# Patient Record
Sex: Male | Born: 1955 | Race: White | Hispanic: No | Marital: Married | Smoking: Former smoker
Health system: Southern US, Community
[De-identification: ages and names within clinical notes are randomized; demographics above are authoritative.]

## PROBLEM LIST (undated history)

## (undated) DIAGNOSIS — E785 Hyperlipidemia, unspecified: Secondary | ICD-10-CM

## (undated) HISTORY — DX: Hyperlipidemia, unspecified: E78.5

## (undated) HISTORY — PX: EYE SURGERY: SHX253

---

## 2012-01-02 ENCOUNTER — Ambulatory Visit (INDEPENDENT_AMBULATORY_CARE_PROVIDER_SITE_OTHER): Payer: BC Managed Care – PPO | Admitting: Family Medicine

## 2012-01-02 ENCOUNTER — Encounter: Payer: Self-pay | Admitting: Family Medicine

## 2012-01-02 VITALS — BP 145/85 | HR 62 | Temp 97.9°F | Resp 16 | Ht 72.0 in | Wt 265.2 lb

## 2012-01-02 DIAGNOSIS — E782 Mixed hyperlipidemia: Secondary | ICD-10-CM

## 2012-01-02 DIAGNOSIS — Z23 Encounter for immunization: Secondary | ICD-10-CM

## 2012-01-02 DIAGNOSIS — Z0000 Encounter for general adult medical examination without abnormal findings: Secondary | ICD-10-CM

## 2012-01-02 DIAGNOSIS — E785 Hyperlipidemia, unspecified: Secondary | ICD-10-CM

## 2012-01-02 DIAGNOSIS — N529 Male erectile dysfunction, unspecified: Secondary | ICD-10-CM

## 2012-01-02 MED ORDER — TETANUS-DIPHTH-ACELL PERTUSSIS 5-2.5-18.5 LF-MCG/0.5 IM SUSP
0.5000 mL | Freq: Once | INTRAMUSCULAR | Status: AC
  Administered 2012-01-02: 0.5 mL via INTRAMUSCULAR

## 2012-01-02 MED ORDER — SILDENAFIL CITRATE 100 MG PO TABS: 50.0000 mg | ORAL_TABLET | Freq: Every day | ORAL | Status: DC | PRN

## 2012-01-02 MED ORDER — SIMVASTATIN 10 MG PO TABS: 10.0000 mg | ORAL_TABLET | Freq: Every day | ORAL | Status: DC

## 2012-01-02 NOTE — Progress Notes (Signed)
  Subjective:    Patient ID: Curtis Gallegos, male    DOB: 22-Feb-1956, 56 y.o.   MRN: [id number]  HPI Curtis Gallegos is a 56 y.o. male New patient.  Here for annual exam.  See patient health survey. Colonoscopy at age 53 - normal EKG WNL 2 years ago. Hx of elevated PSA - had biopsy that was benign - outside records pending  Hyperlipidemia - on zocor 10mg  qd.  No  New side effects.  ED - prior on Viagra - 2x/month  No side effects.  Review of Systems See 13 point ros on PHS.    Objective:   Physical Exam  Constitutional: He is oriented to person, place, and time. He appears well-developed and well-nourished.  HENT:  Head: Normocephalic and atraumatic.  Right Ear: External ear normal.  Left Ear: External ear normal.  Mouth/Throat: Oropharynx is clear and moist.  Eyes: Conjunctivae and EOM are normal. Pupils are equal, round, and reactive to light.  Neck: Normal range of motion. Neck supple. No thyromegaly present.  Cardiovascular: Normal rate, regular rhythm, normal heart sounds and intact distal pulses.   Pulmonary/Chest: Effort normal and breath sounds normal. No respiratory distress. He has no wheezes.  Abdominal: Soft. He exhibits no distension. There is no tenderness. Hernia confirmed negative in the right inguinal area and confirmed negative in the left inguinal area.  Genitourinary:       Prostate exam declined.  Musculoskeletal: Normal range of motion. He exhibits no edema and no tenderness.  Lymphadenopathy:    He has no cervical adenopathy.  Neurological: He is alert and oriented to person, place, and time. He has normal reflexes.  Skin: Skin is warm and dry.  Psychiatric: He has a normal mood and affect. His behavior is normal.       Assessment & Plan:  Curtis Gallegos is a 56 y.o. male 1. Need for diphtheria-tetanus-pertussis (Tdap) vaccine  TDaP (BOOSTRIX) injection 0.5 mL  2. Annual physical exam  TDaP (BOOSTRIX) injection 0.5 mL, Comprehensive metabolic panel, CBC, PSA    3. Hyperlipidemia  Comprehensive metabolic panel, Lipid panel  4. Erectile dysfunction     Cont zocor at same dose until labs back as above. Check BP outside office - RTC if remains above over 140/90 Obtaining outside records. Check cbc, lipids, cmp, psa tdap updated.

## 2012-01-03 LAB — COMPREHENSIVE METABOLIC PANEL
Albumin: 4.5 g/dL (ref 3.5–5.2)
BUN: 20 mg/dL (ref 6–23)
CO2: 27 mEq/L (ref 19–32)
Calcium: 9.6 mg/dL (ref 8.4–10.5)
Chloride: 102 mEq/L (ref 96–112)
Creat: 0.84 mg/dL (ref 0.50–1.35)
Glucose, Bld: 99 mg/dL (ref 70–99)
Potassium: 4.2 mEq/L (ref 3.5–5.3)

## 2012-01-03 LAB — CBC
HCT: 45 % (ref 39.0–52.0)
MCH: 30.2 pg (ref 26.0–34.0)
MCV: 90.7 fL (ref 78.0–100.0)
Platelets: 303 10*3/uL (ref 150–400)
RDW: 13.4 % (ref 11.5–15.5)

## 2012-01-03 LAB — LIPID PANEL
Cholesterol: 220 mg/dL — ABNORMAL HIGH (ref 0–200)
HDL: 58 mg/dL
LDL Cholesterol: 144 mg/dL — ABNORMAL HIGH (ref 0–99)
Total CHOL/HDL Ratio: 3.8 ratio
Triglycerides: 88 mg/dL
VLDL: 18 mg/dL (ref 0–40)

## 2012-01-03 LAB — PSA: PSA: 3.29 ng/mL

## 2012-03-23 ENCOUNTER — Telehealth: Payer: Self-pay

## 2012-03-23 NOTE — Telephone Encounter (Signed)
Pt is out of his rx of sinvastatin and [facility] states they will refill on the 20th but he is out of meds and needs some to get him thru til the 20th [facility] [address]

## 2012-03-24 MED ORDER — SIMVASTATIN 20 MG PO TABS: 20.0000 mg | ORAL_TABLET | Freq: Every day | ORAL | Status: DC

## 2012-03-24 NOTE — Telephone Encounter (Signed)
Pt notified that rx was sent in 

## 2012-03-24 NOTE — Telephone Encounter (Signed)
I sent in Zocor 20mg  for patient.  He should only have to take 1 pill with this Rx.

## 2012-04-21 ENCOUNTER — Ambulatory Visit: Payer: BC Managed Care – PPO

## 2012-04-21 ENCOUNTER — Ambulatory Visit: Payer: BC Managed Care – PPO | Admitting: Family Medicine

## 2012-04-21 VITALS — BP 155/92 | HR 65 | Temp 97.8°F | Resp 18 | Ht 72.0 in | Wt 266.8 lb

## 2012-04-21 DIAGNOSIS — M25473 Effusion, unspecified ankle: Secondary | ICD-10-CM

## 2012-04-21 DIAGNOSIS — M25476 Effusion, unspecified foot: Secondary | ICD-10-CM

## 2012-04-21 DIAGNOSIS — M25579 Pain in unspecified ankle and joints of unspecified foot: Secondary | ICD-10-CM

## 2012-04-21 MED ORDER — NAPROXEN 500 MG PO TABS: 500.0000 mg | ORAL_TABLET | Freq: Two times a day (BID) | ORAL | Status: DC

## 2012-04-21 NOTE — Progress Notes (Signed)
[facility]:  Office Visit  Chief Complaint:  Chief Complaint  Patient presents with  . Ankle Pain     swollen and feel like a lump x 2 months    HPI: Curtis Gallegos is a 56 y.o. male who complains of  2 month h/o of ankle pain. After exercising. No other injuries. Tried a boot his sister gave him and started getting wounds on leg from pressure. Pain with increase walking and with dorsiflexion of foot. Sharp, intermittent. Tried Ibuprofen without relief. No steroid use or recent abx use  Past Medical History  Diagnosis Date  . Hyperlipidemia    History reviewed. No pertinent past surgical history. History   Social History  . Marital Status: Married    Spouse Name: N/A    Number of Children: N/A  . Years of Education: N/A   Social History Main Topics  . Smoking status: Former Smoker    Quit date: 01/02/2003  . Smokeless tobacco: None  . Alcohol Use: None  . Drug Use: None  . Sexually Active: None   Other Topics Concern  . None   Social History Narrative  . None   No family history on file. No Known Allergies Prior to Admission medications   Medication Sig Start Date End Date Taking? Authorizing Provider  ibuprofen (ADVIL,MOTRIN) 400 MG tablet Take 400 mg by mouth every 6 (six) hours as needed.   Yes Historical Provider, MD  simvastatin (ZOCOR) 20 MG tablet Take 1 tablet (20 mg total) by mouth at bedtime. 03/24/12 03/24/13 Yes Sarah Harvie Bridge, PA-C  sildenafil (VIAGRA) 100 MG tablet Take 0.5-1 tablets (50-100 mg total) by mouth daily as needed for erectile dysfunction. 01/02/12 02/01/12  Shade Flood, MD     ROS: The patient denies fevers, chills, night sweats, unintentional weight loss, chest pain, palpitations, wheezing, dyspnea on exertion, nausea, vomiting, abdominal pain, dysuria, hematuria, melena, numbness, weakness, or tingling.   All other systems have been reviewed and were otherwise negative with the exception of those mentioned in the HPI  and as above.    PHYSICAL EXAM: Filed Vitals:   04/21/12 2013  BP: 155/92  Pulse: 65  Temp: 97.8 F (36.6 C)  Resp: 18   Filed Vitals:   04/21/12 2013  Height: 6' (1.829 m)  Weight: 266 lb 12.8 oz (121.02 kg)   Body mass index is 36.18 kg/(m^2).  General: Alert, no acute distress HEENT:  Normocephalic, atraumatic, oropharynx patent.  Cardiovascular:  Regular rate and rhythm, no rubs murmurs or gallops.  No Carotid bruits, radial pulse intact. No pedal edema.  Respiratory: Clear to auscultation bilaterally.  No wheezes, rales, or rhonchi.  No cyanosis, no use of accessory musculature GI: No organomegaly, abdomen is soft and non-tender, positive bowel sounds.  No masses. Skin: No rashes. Neurologic: Facial musculature symmetric. Psychiatric: Patient is appropriate throughout our interaction. Lymphatic: No cervical lymphadenopathy Musculoskeletal: Gait intact. ROM and sensation intact 5.5 strength 2/2 DTR + softe tissue swelling lateral, medial malleoli and at achilles, no appreciable tendon rupture    LABS: Results for orders placed in visit on 01/02/12  COMPREHENSIVE METABOLIC PANEL      Component Value Range   Sodium 138  135 - 145 mEq/L   Potassium 4.2  3.5 - 5.3 mEq/L   Chloride 102  96 - 112 mEq/L   CO2 27  19 - 32 mEq/L   Glucose, Bld 99  70 - 99 mg/dL   BUN 20  6 -  23 mg/dL   Creat 1.61  0.96 - 0.45 mg/dL   Total Bilirubin 0.5  0.3 - 1.2 mg/dL   Alkaline Phosphatase 55  39 - 117 U/L   AST 17  0 - 37 U/L   ALT 16  0 - 53 U/L   Total Protein 7.2  6.0 - 8.3 g/dL   Albumin 4.5  3.5 - 5.2 g/dL   Calcium 9.6  8.4 - 40.9 mg/dL  LIPID PANEL      Component Value Range   Cholesterol 220 (*) 0 - 200 mg/dL   Triglycerides 88  <811 mg/dL   HDL 58  >91 mg/dL   Total CHOL/HDL Ratio 3.8     VLDL 18  0 - 40 mg/dL   LDL Cholesterol 478 (*) 0 - 99 mg/dL  CBC      Component Value Range   WBC 6.0  4.0 - 10.5 K/uL   RBC 4.96  4.22 - 5.81 MIL/uL   Hemoglobin 15.0  13.0 -  17.0 g/dL   HCT 29.5  62.1 - 30.8 %   MCV 90.7  78.0 - 100.0 fL   MCH 30.2  26.0 - 34.0 pg   MCHC 33.3  30.0 - 36.0 g/dL   RDW 65.7  84.6 - 96.2 %   Platelets 303  150 - 400 K/uL  PSA      Component Value Range   PSA 3.29  <=4.00 ng/mL     EKG/XRAY:   Primary read interpreted by Dr. Conley Rolls at [facility]. No fx or dislocation + soft tissue swelling achilles   ASSESSMENT/PLAN: Encounter Diagnoses  Name Primary?  Marland Kitchen Ankle pain Yes  . Ankle swelling    Most likely Achilles tendonopathy ROM exercises, advise to purchase heel lift ( patient declined cam boot) Rx NSAID He will try ROM exercises at home and if no improvement then would like to be referred to PT.     Serah Nicoletti PHUONG, DO 04/22/2012 7:54 AM

## 2012-04-22 ENCOUNTER — Encounter: Payer: Self-pay | Admitting: Family Medicine

## 2012-04-27 ENCOUNTER — Other Ambulatory Visit: Payer: Self-pay | Admitting: Physician Assistant

## 2012-05-07 ENCOUNTER — Telehealth: Payer: Self-pay

## 2012-05-07 NOTE — Telephone Encounter (Signed)
[place] is going [place] and would like for a request for rx refill on viagra sent to [facility] in [place] off on [address] [phone]

## 2012-05-08 MED ORDER — SILDENAFIL CITRATE 100 MG PO TABS: 50.0000 mg | ORAL_TABLET | Freq: Every day | ORAL | Status: DC | PRN

## 2012-05-08 NOTE — Telephone Encounter (Signed)
Sent!

## 2012-05-08 NOTE — Telephone Encounter (Signed)
Called patient to advise  °

## 2012-05-08 NOTE — Telephone Encounter (Signed)
Ok x 2 months  

## 2012-08-09 ENCOUNTER — Other Ambulatory Visit: Payer: Self-pay | Admitting: Physician Assistant

## 2012-08-10 NOTE — Telephone Encounter (Signed)
Needs OV - 2nd notice 

## 2012-08-17 ENCOUNTER — Telehealth: Payer: Self-pay

## 2012-08-17 MED ORDER — TADALAFIL 10 MG PO TABS: 10.0000 mg | ORAL_TABLET | Freq: Every day | ORAL | Status: DC | PRN

## 2012-08-17 NOTE — Telephone Encounter (Signed)
PT REQUESTING CIALIS RX INSTEAD OF VIAGRA SINCE HE HAS A COUPON FOR THE CIALIS   BEST PHONE [phone]   PHARMACY [facility]

## 2012-08-17 NOTE — Telephone Encounter (Signed)
Sent Cialis to pharamcy, pt will need OV for more

## 2012-08-18 NOTE — Telephone Encounter (Signed)
Pt answered the phone but I didn't speak with him. I will try to call back later

## 2012-08-18 NOTE — Telephone Encounter (Signed)
Spoke with pt advised Rx sent to pharmacy. 

## 2012-09-20 ENCOUNTER — Other Ambulatory Visit: Payer: Self-pay | Admitting: Physician Assistant

## 2012-09-20 ENCOUNTER — Telehealth: Payer: Self-pay

## 2012-09-20 NOTE — Telephone Encounter (Signed)
Needs office visit.

## 2012-09-20 NOTE — Telephone Encounter (Signed)
PT. NEEDS A REFILL OF HIS CHOLESTEROL MEDS UNTIL HE IS ABLE TO RECHECK. PLEASE CALL  HE USES THE [facility] ON [facility] & [place] [phone]

## 2012-09-20 NOTE — Telephone Encounter (Signed)
PT NEEDS A REFILL OF HIS CHOLESTEROL MEDS UNTIL HE CAN RECHECK. HE USES THE [facility] [address]  HIS # [phone]

## 2012-09-21 NOTE — Telephone Encounter (Signed)
Ryan sent in yesterday.

## 2012-10-26 ENCOUNTER — Other Ambulatory Visit: Payer: Self-pay | Admitting: Physician Assistant

## 2012-10-27 ENCOUNTER — Other Ambulatory Visit: Payer: Self-pay | Admitting: Physician Assistant

## 2012-11-15 ENCOUNTER — Encounter: Payer: Self-pay | Admitting: Family Medicine

## 2012-11-15 ENCOUNTER — Ambulatory Visit (INDEPENDENT_AMBULATORY_CARE_PROVIDER_SITE_OTHER): Payer: BC Managed Care – PPO | Admitting: Family Medicine

## 2012-11-15 VITALS — BP 138/88 | HR 68 | Temp 96.9°F | Resp 16 | Ht 72.0 in | Wt 260.0 lb

## 2012-11-15 DIAGNOSIS — N529 Male erectile dysfunction, unspecified: Secondary | ICD-10-CM

## 2012-11-15 DIAGNOSIS — Z87898 Personal history of other specified conditions: Secondary | ICD-10-CM

## 2012-11-15 DIAGNOSIS — E785 Hyperlipidemia, unspecified: Secondary | ICD-10-CM

## 2012-11-15 DIAGNOSIS — Z0000 Encounter for general adult medical examination without abnormal findings: Secondary | ICD-10-CM

## 2012-11-15 MED ORDER — SIMVASTATIN 20 MG PO TABS: ORAL_TABLET | ORAL | Status: DC

## 2012-11-15 MED ORDER — SILDENAFIL CITRATE 100 MG PO TABS: 50.0000 mg | ORAL_TABLET | Freq: Every day | ORAL | Status: DC | PRN

## 2012-11-15 NOTE — Progress Notes (Signed)
  Subjective:    Patient ID: Curtis Gallegos, male    DOB: 1956-01-08, 57 y.o.   MRN: [id number]  HPI    Review of Systems  Musculoskeletal: Positive for gait problem.       Problem with walking- Right ankle       Objective:   Physical Exam        Assessment & Plan:

## 2012-11-15 NOTE — Patient Instructions (Addendum)
Your should receive a call or letter about your lab results within the next week to 10 days.  Heat if needed and stretches to the achilles soreness, but recheck next few months if persistent. Return to the clinic or go to the nearest emergency room if any of your symptoms worsen or new symptoms occur.  Plan on recheck in 6 months.   Keeping you healthy  Get these tests  Blood pressure- Have your blood pressure checked once a year by your healthcare Alley Neils.  Normal blood pressure is 120/80  Weight- Have your body mass index (BMI) calculated to screen for obesity.  BMI is a measure of body fat based on height and weight. You can also calculate your own BMI at [email or web address].  Cholesterol- Have your cholesterol checked every year.  Diabetes- Have your blood sugar checked regularly if you have high blood pressure, high cholesterol, have a family history of diabetes or if you are overweight.  Screening for Colon Cancer- Colonoscopy starting at age 33.  Screening may begin sooner depending on your family history and other health conditions. Follow up colonoscopy as directed by your Gastroenterologist.  Screening for Prostate Cancer- Both blood work (PSA) and a rectal exam help screen for Prostate Cancer.  Screening begins at age 66 with African-American men and at age 27 with Caucasian men.  Screening may begin sooner depending on your family history.  Take these medicines  Aspirin- One aspirin daily can help prevent Heart disease and Stroke.  Flu shot- Every fall.  Tetanus- Every 10 years.  Zostavax- Once after the age of 56 to prevent Shingles.  Pneumonia shot- Once after the age of 58; if you are younger than 77, ask your healthcare Otie Headlee if you need a Pneumonia shot.  Take these steps  Don't smoke- If you do smoke, talk to your doctor about quitting.  For tips on how to quit, go to www.smokefree.gov or call 1-800-QUIT-NOW.  Be physically active- Exercise 5 days a  week for at least 30 minutes.  If you are not already physically active start slow and gradually work up to 30 minutes of moderate physical activity.  Examples of moderate activity include walking briskly, mowing the yard, dancing, swimming, bicycling, etc.  Eat a healthy diet- Eat a variety of healthy food such as fruits, vegetables, low fat milk, low fat cheese, yogurt, lean meant, poultry, fish, beans, tofu, etc. For more information go to www.thenutritionsource.org  Drink alcohol in moderation- Limit alcohol intake to less than two drinks a day. Never drink and drive.  Dentist- Brush and floss twice daily; visit your dentist twice a year.  Depression- Your emotional health is as important as your physical health. If you're feeling down, or losing interest in things you would normally enjoy please talk to your healthcare Dima Ferrufino.  Eye exam- Visit your eye doctor every year.  Safe sex- If you may be exposed to a sexually transmitted infection, use a condom.  Seat belts- Seat belts can save your life; always wear one.  Smoke/Carbon Monoxide detectors- These detectors need to be installed on the appropriate level of your home.  Replace batteries at least once a year.  Skin cancer- When out in the sun, cover up and use sunscreen 15 SPF or higher.  Violence- If anyone is threatening you, please tell your healthcare Mariesa Grieder.  Living Will/ Health care power of attorney- Speak with your healthcare Daley Mooradian and family.

## 2012-11-15 NOTE — Progress Notes (Signed)
Subjective:    Patient ID: Curtis Gallegos, male    DOB: November 04, 1955, 57 y.o.   MRN: [id number]  HPI Curtis Gallegos is a 57 y.o. male Here for annual exam  Colonoscopy at age 66 - normal  - possible polyp removed  In [place],  [place].  Possible polyp removed,  Unknown when to repeat colonoscopy. Previous PCP -  Flu vaccine in October 2013 tdap given at  annual exam 12/2011- stress test normal about 3 years ago.  EKG WNL approx 3 years ago.  Hx of elevated PSA - had biopsy that was benign in [place] - 3 years ago - follow up plan to have PSA followed.  PSA 3.29 here in April 2013.  Has not had rechecked since. No hematuria. No hesitancy, no driblling, no incontinence. No abd pain/pelvic pain. No unexplained wt. loss or night sweats.  walking on weekends only for exercise.  Hyperlipidemia - on zocor 20 mg qd. No new side effects. No new myalgias, but has ocasional pain in R achilles - see last ov.  Weight down from 265 to 260 since last CPE.  Results for orders placed in visit on 01/02/12  COMPREHENSIVE METABOLIC PANEL      Result Value Range   Sodium 138  135 - 145 mEq/L   Potassium 4.2  3.5 - 5.3 mEq/L   Chloride 102  96 - 112 mEq/L   CO2 27  19 - 32 mEq/L   Glucose, Bld 99  70 - 99 mg/dL   BUN 20  6 - 23 mg/dL   Creat 4.09  8.11 - 9.14 mg/dL   Total Bilirubin 0.5  0.3 - 1.2 mg/dL   Alkaline Phosphatase 55  39 - 117 U/L   AST 17  0 - 37 U/L   ALT 16  0 - 53 U/L   Total Protein 7.2  6.0 - 8.3 g/dL   Albumin 4.5  3.5 - 5.2 g/dL   Calcium 9.6  8.4 - 78.2 mg/dL  LIPID PANEL      Result Value Range   Cholesterol 220 (*) 0 - 200 mg/dL   Triglycerides 88  <956 mg/dL   HDL 58  >21 mg/dL   Total CHOL/HDL Ratio 3.8     VLDL 18  0 - 40 mg/dL   LDL Cholesterol 308 (*) 0 - 99 mg/dL  CBC      Result Value Range   WBC 6.0  4.0 - 10.5 K/uL   RBC 4.96  4.22 - 5.81 MIL/uL   Hemoglobin 15.0  13.0 - 17.0 g/dL   HCT 65.7  84.6 - 96.2 %   MCV 90.7  78.0 - 100.0 fL   MCH 30.2  26.0 - 34.0 pg   MCHC  33.3  30.0 - 36.0 g/dL   RDW 95.2  84.1 - 32.4 %   Platelets 303  150 - 400 K/uL  PSA      Result Value Range   PSA 3.29  <=4.00 ng/mL    ED - prior on Viagra - 2x/month - split 100mg  tabs - no headache lightheadedness.  No side effects, then tried Cialis - prefers Viagra.     Review of Systems  Constitutional: Negative for fatigue and unexpected weight change.  Eyes: Negative for visual disturbance.  Respiratory: Negative for cough, chest tightness and shortness of breath.   Cardiovascular: Negative for chest pain, palpitations and leg swelling.  Gastrointestinal: Negative for abdominal pain and blood in stool.  Genitourinary: Negative for dysuria, urgency, frequency,  hematuria, decreased urine volume and difficulty urinating.  Musculoskeletal: Positive for myalgias (R achilles stiff if driving for a long time, treats with stretches.  less sore nw than last summer, only  episodic flair., no meds needed. ).  Neurological: Negative for dizziness, light-headedness and headaches.  All other systems reviewed and are negative.        Objective:   Physical Exam  Vitals reviewed. Constitutional: He is oriented to person, place, and time. He appears well-developed and well-nourished.  HENT:  Head: Normocephalic and atraumatic.  Right Ear: External ear normal.  Left Ear: External ear normal.  Mouth/Throat: Oropharynx is clear and moist.  Eyes: Conjunctivae and EOM are normal. Pupils are equal, round, and reactive to light.  Neck: Normal range of motion. Neck supple. No thyromegaly present.  Cardiovascular: Normal rate, regular rhythm, normal heart sounds and intact distal pulses.   Pulmonary/Chest: Effort normal and breath sounds normal. No respiratory distress. He has no wheezes.  Abdominal: Soft. He exhibits no distension. There is no tenderness. Hernia confirmed negative in the right inguinal area and confirmed negative in the left inguinal area.  Genitourinary: Prostate normal.   Difficult exam, able to reach distal prostate - no apparent abnormalities.   Musculoskeletal: Normal range of motion. He exhibits no edema and no tenderness.  Lymphadenopathy:    He has no cervical adenopathy.  Neurological: He is alert and oriented to person, place, and time. He has normal reflexes.  Skin: Skin is warm and dry.  Psychiatric: He has a normal mood and affect. His behavior is normal.   EKG: sinus brady at 57, L axis, no acute findings - no prior available for review.   Results for orders placed in visit on 11/15/12  IFOBT (OCCULT BLOOD)      Result Value Range   IFOBT Negative        Assessment & Plan:  Curtis Gallegos is a 57 y.o. male Annual physical exam - Plan: IFOBT POC (occult bld, rslt in office), CBC, PSA, Comprehensive metabolic panel, EKG 12-Lead - no concerning findings.  Anticipatory guidance reviewed as below.  Patient to call GI in [place] where he had colonoscopy to send records to me or to determine when next colonoscopy due. hemosure negative as above.   History of elevated PSA - Plan: PSA.  Determine follow up based on PSA.   Other and unspecified hyperlipidemia - Plan: Comprehensive metabolic panel, Lipid panel, EKG 12-Lead, simvastatin (ZOCOR) 20 MG tablet refilled labs ordered. Can work on increased walking as able for exercise.   Erectile dysfunction - Plan: sildenafil (VIAGRA) 100 MG tablet in place of Cialis.  1/2 prn, sed, and CP/orhtostaic precautions reviewed.  Patient Instructions  Your should receive a call or letter about your lab results within the next week to 10 days.  Heat if needed and stretches to the achilles soreness, but recheck next few months if persistent. Return to the clinic or go to the nearest emergency room if any of your symptoms worsen or new symptoms occur.  Plan on recheck in 6 months.   Keeping you healthy  Get these tests  Blood pressure- Have your blood pressure checked once a year by your healthcare provider.   Normal blood pressure is 120/80  Weight- Have your body mass index (BMI) calculated to screen for obesity.  BMI is a measure of body fat based on height and weight. You can also calculate your own BMI at [email or web address].  Cholesterol- Have your cholesterol checked every year.  Diabetes- Have your blood sugar checked regularly if you have high blood pressure, high cholesterol, have a family history of diabetes or if you are overweight.  Screening for Colon Cancer- Colonoscopy starting at age 47.  Screening may begin sooner depending on your family history and other health conditions. Follow up colonoscopy as directed by your Gastroenterologist.  Screening for Prostate Cancer- Both blood work (PSA) and a rectal exam help screen for Prostate Cancer.  Screening begins at age 25 with African-American men and at age 38 with Caucasian men.  Screening may begin sooner depending on your family history.  Take these medicines  Aspirin- One aspirin daily can help prevent Heart disease and Stroke.  Flu shot- Every fall.  Tetanus- Every 10 years.  Zostavax- Once after the age of 80 to prevent Shingles.  Pneumonia shot- Once after the age of 43; if you are younger than 61, ask your healthcare provider if you need a Pneumonia shot.  Take these steps  Don't smoke- If you do smoke, talk to your doctor about quitting.  For tips on how to quit, go to www.smokefree.gov or call 1-800-QUIT-NOW.  Be physically active- Exercise 5 days a week for at least 30 minutes.  If you are not already physically active start slow and gradually work up to 30 minutes of moderate physical activity.  Examples of moderate activity include walking briskly, mowing the yard, dancing, swimming, bicycling, etc.  Eat a healthy diet- Eat a variety of healthy food such as fruits, vegetables, low fat milk, low fat cheese, yogurt, lean meant, poultry, fish, beans, tofu, etc. For more information go to  www.thenutritionsource.org  Drink alcohol in moderation- Limit alcohol intake to less than two drinks a day. Never drink and drive.  Dentist- Brush and floss twice daily; visit your dentist twice a year.  Depression- Your emotional health is as important as your physical health. If you're feeling down, or losing interest in things you would normally enjoy please talk to your healthcare provider.  Eye exam- Visit your eye doctor every year.  Safe sex- If you may be exposed to a sexually transmitted infection, use a condom.  Seat belts- Seat belts can save your life; always wear one.  Smoke/Carbon Monoxide detectors- These detectors need to be installed on the appropriate level of your home.  Replace batteries at least once a year.  Skin cancer- When out in the sun, cover up and use sunscreen 15 SPF or higher.  Violence- If anyone is threatening you, please tell your healthcare provider.  Living Will/ Health care power of attorney- Speak with your healthcare provider and family.

## 2012-11-16 LAB — COMPREHENSIVE METABOLIC PANEL
AST: 20 U/L (ref 0–37)
BUN: 14 mg/dL (ref 6–23)
Calcium: 9.4 mg/dL (ref 8.4–10.5)
Chloride: 102 mEq/L (ref 96–112)
Creat: 0.91 mg/dL (ref 0.50–1.35)
Glucose, Bld: 93 mg/dL (ref 70–99)

## 2012-11-16 LAB — LIPID PANEL
HDL: 52 mg/dL (ref >39)
Total CHOL/HDL Ratio: 3.9 Ratio
Triglycerides: 150 mg/dL — ABNORMAL HIGH (ref <150)

## 2012-11-16 LAB — CBC
MCH: 30.9 pg (ref 26.0–34.0)
MCV: 86.9 fL (ref 78.0–100.0)
Platelets: 287 10*3/uL (ref 150–400)
RBC: 5.05 MIL/uL (ref 4.22–5.81)
RDW: 13.9 % (ref 11.5–15.5)

## 2012-12-22 ENCOUNTER — Telehealth: Payer: Self-pay

## 2012-12-22 NOTE — Telephone Encounter (Signed)
Curtis Gallegos is calling about needing to get some records faxed and he was told that he would need to come in and fill out a release. He is not able to come in and fill it out but was wondering if he could  Have someone fax him the release to fill out. His fax number is [phone]. His call back number is [phone]

## 2012-12-22 NOTE — Telephone Encounter (Signed)
Release of information form faxed with confirmation.

## 2013-02-14 ENCOUNTER — Ambulatory Visit (INDEPENDENT_AMBULATORY_CARE_PROVIDER_SITE_OTHER): Payer: BC Managed Care – PPO | Admitting: Family Medicine

## 2013-02-14 ENCOUNTER — Encounter: Payer: Self-pay | Admitting: Family Medicine

## 2013-02-14 VITALS — BP 118/66 | HR 88 | Temp 98.1°F | Resp 16 | Ht 72.0 in | Wt 267.8 lb

## 2013-02-14 DIAGNOSIS — L02419 Cutaneous abscess of limb, unspecified: Secondary | ICD-10-CM

## 2013-02-14 DIAGNOSIS — L255 Unspecified contact dermatitis due to plants, except food: Secondary | ICD-10-CM

## 2013-02-14 DIAGNOSIS — L03115 Cellulitis of right lower limb: Secondary | ICD-10-CM

## 2013-02-14 LAB — POCT CBC
Granulocyte percent: 74.2 %G (ref 37–80)
HCT, POC: 51.1 % (ref 43.5–53.7)
Lymph, poc: 1.5 (ref 0.6–3.4)
MCH, POC: 31.2 pg (ref 27–31.2)
MCHC: 32.1 g/dL (ref 31.8–35.4)
MCV: 97.2 fL — AB (ref 80–97)
MID (cbc): 0.4 (ref 0–0.9)
POC LYMPH PERCENT: 20.4 %L (ref 10–50)
Platelet Count, POC: 298 10*3/uL (ref 142–424)
RDW, POC: 12.9 %
WBC: 7.3 10*3/uL (ref 4.6–10.2)

## 2013-02-14 MED ORDER — DOXYCYCLINE HYCLATE 100 MG PO TABS: 100.0000 mg | ORAL_TABLET | Freq: Two times a day (BID) | ORAL | Status: DC

## 2013-02-14 MED ORDER — PREDNISONE 20 MG PO TABS: ORAL_TABLET | ORAL | Status: DC

## 2013-02-14 NOTE — Patient Instructions (Signed)
Your rash appears to be primarily from poison ivy, but possible secondary bacterial infection. Start both the prednisone and doxycycline, cleanse areas with soap and water 1-2 times per day, and recheck with Dr. Neva Seat in 2 days from 8:30 to 4.  Return to the clinic or go to the nearest emergency room if any of your symptoms worsen or new symptoms occur.    Cellulitis Cellulitis is an infection of the skin and the tissue beneath it. The infected area is usually red and tender. Cellulitis occurs most often in the arms and lower legs.  CAUSES  Cellulitis is caused by bacteria that enter the skin through cracks or cuts in the skin. The most common types of bacteria that cause cellulitis are Staphylococcus and Streptococcus. SYMPTOMS   Redness and warmth.  Swelling.  Tenderness or pain.  Fever. DIAGNOSIS  Your caregiver can usually determine what is wrong based on a physical exam. Blood tests may also be done. TREATMENT  Treatment usually involves taking an antibiotic medicine. HOME CARE INSTRUCTIONS   Take your antibiotics as directed. Finish them even if you start to feel better.  Keep the infected arm or leg elevated to reduce swelling.  Apply a warm cloth to the affected area up to 4 times per day to relieve pain.  Only take over-the-counter or prescription medicines for pain, discomfort, or fever as directed by your caregiver.  Keep all follow-up appointments as directed by your caregiver. SEEK MEDICAL CARE IF:   You notice red streaks coming from the infected area.  Your red area gets larger or turns dark in color.  Your bone or joint underneath the infected area becomes painful after the skin has healed.  Your infection returns in the same area or another area.  You notice a swollen bump in the infected area.  You develop new symptoms. SEEK IMMEDIATE MEDICAL CARE IF:   You have a fever.  You feel very sleepy.  You develop vomiting or diarrhea.  You have a general  ill feeling (malaise) with muscle aches and pains. MAKE SURE YOU:   Understand these instructions.  Will watch your condition.  Will get help right away if you are not doing well or get worse. Document Released: 06/04/2005 Document Revised: 02/24/2012 Document Reviewed: 11/10/2011 [facility] Patient Information 2014 [place], [place]. Poison [employer] ivy is a inflammation of the skin (contact dermatitis) caused by touching the allergens on the leaves of the ivy plant following previous exposure to the plant. The rash usually appears 48 hours after exposure. The rash is usually bumps (papules) or blisters (vesicles) in a linear pattern. Depending on your own sensitivity, the rash may simply cause redness and itching, or it may also progress to blisters which may break open. These must be well cared for to prevent secondary bacterial (germ) infection, followed by scarring. Keep any open areas dry, clean, dressed, and covered with an antibacterial ointment if needed. The eyes may also get puffy. The puffiness is worst in the morning and gets better as the day progresses. This dermatitis usually heals without scarring, within 2 to 3 weeks without treatment. HOME CARE INSTRUCTIONS  Thoroughly wash with soap and water as soon as you have been exposed to poison ivy. You have about one half hour to remove the plant resin before it will cause the rash. This washing will destroy the oil or antigen on the skin that is causing, or will cause, the rash. Be sure to wash under your fingernails as any plant  resin there will continue to spread the rash. Do not rub skin vigorously when washing affected area. Poison ivy cannot spread if no oil from the plant remains on your body. A rash that has progressed to weeping sores will not spread the rash unless you have not washed thoroughly. It is also important to wash any clothes you have been wearing as these may carry active allergens. The rash will return if you wear the  unwashed clothing, even several days later. Avoidance of the plant in the future is the best measure. Poison ivy plant can be recognized by the number of leaves. Generally, poison ivy has three leaves with flowering branches on a single stem. Diphenhydramine may be purchased over the counter and used as needed for itching. Do not drive with this medication if it makes you drowsy.Ask your caregiver about medication for children. SEEK MEDICAL CARE IF:  Open sores develop.  Redness spreads beyond area of rash.  You notice purulent (pus-like) discharge.  You have increased pain.  Other signs of infection develop (such as fever). Document Released: 08/22/2000 Document Revised: 11/17/2011 Document Reviewed: 07/11/2009 [facility] Patient Information 2014 [place], [place].

## 2013-02-14 NOTE — Progress Notes (Signed)
Subjective:    Patient ID: Curtis Gallegos, male    DOB: 27-Jun-1956, 57 y.o.   MRN: [id number]  HPI Curtis Gallegos is a 57 y.o. male  Golfing about 9 days ago - wearing shorts - walked through the woods. Blistering rash on inside of knees - 6 days ago - spreading since then.  Now up and down both legs and R arm.  No genital or face involvement.  No new meds/otc meds.  No fever, feels well otherwise, but has had some chills at times.  Slight itch, but legs feel somewhat stiff in areas of swelling.   Tx: cortisone topically, then other otc poison ivy lotions including calamine.    Review of Systems  Constitutional: Positive for chills. Negative for fever.  Skin: Positive for color change and rash.   Past Medical History  Diagnosis Date  . Hyperlipidemia    Past Surgical History  Procedure Laterality Date  . Eye surgery     No Known Allergies Prior to Admission medications   Medication Sig Start Date End Date Taking? Authorizing Provider  ibuprofen (ADVIL,MOTRIN) 400 MG tablet Take 400 mg by mouth every 6 (six) hours as needed.   Yes Historical Provider, MD  sildenafil (VIAGRA) 100 MG tablet Take 0.5-1 tablets (50-100 mg total) by mouth daily as needed for erectile dysfunction. 11/15/12  Yes Shade Flood, MD  simvastatin (ZOCOR) 20 MG tablet TAKE 1 TABLET BY MOUTH EVERY NIGHT AT BEDTIME 11/15/12  Yes Shade Flood, MD  doxycycline (VIBRA-TABS) 100 MG tablet Take 1 tablet (100 mg total) by mouth 2 (two) times daily. 02/14/13   Shade Flood, MD  naproxen (NAPROSYN) 500 MG tablet Take 1 tablet (500 mg total) by mouth 2 (two) times daily with a meal. 04/21/12 04/21/13  Thao P Le, DO  predniSONE (DELTASONE) 20 MG tablet 3 by mouth for 3 days, then 2 by mouth for 2 days, then 1 by mouth for 2 days, then 1/2 by mouth for 2 days. 02/14/13   Shade Flood, MD  tadalafil (CIALIS) 10 MG tablet Take 1 tablet (10 mg total) by mouth daily as needed for erectile dysfunction. 08/17/12   Godfrey Pick,  PA-C   History   Social History  . Marital Status: Married    Spouse Name: N/A    Number of Children: N/A  . Years of Education: N/A   Occupational History  . Manager    Social History Main Topics  . Smoking status: Former Smoker    Types: Cigarettes    Quit date: 01/02/2003  . Smokeless tobacco: Not on file  . Alcohol Use: Yes     Comment: 4/week  . Drug Use: No  . Sexually Active: Not on file   Other Topics Concern  . Not on file   Social History Narrative  . No narrative on file        Objective:   Physical Exam  Vitals reviewed. Constitutional: He is oriented to person, place, and time. He appears well-developed and well-nourished. No distress.  HENT:  Mouth/Throat: Oropharynx is clear and moist. No oropharyngeal exudate.  Pulmonary/Chest: Effort normal.  Neurological: He is alert and oriented to person, place, and time.  Skin: Skin is warm and dry. Rash noted. Rash is vesicular. There is erythema.     Psychiatric: He has a normal mood and affect. His behavior is normal.   Erythema outlined at top of thighs.   Results for orders placed in visit on 02/14/13  POCT CBC      Result Value Range   WBC 7.3  4.6 - 10.2 K/uL   Lymph, poc 1.5  0.6 - 3.4   POC LYMPH PERCENT 20.4  10 - 50 %L   MID (cbc) 0.4  0 - 0.9   POC MID % 5.4  0 - 12 %M   POC Granulocyte 5.4  2 - 6.9   Granulocyte percent 74.2  37 - 80 %G   RBC 5.26  4.69 - 6.13 M/uL   Hemoglobin 16.4  14.1 - 18.1 g/dL   HCT, POC 16.1  09.6 - 53.7 %   MCV 97.2 (*) 80 - 97 fL   MCH, POC 31.2  27 - 31.2 pg   MCHC 32.1  31.8 - 35.4 g/dL   RDW, POC 04.5     Platelet Count, POC 298  142 - 424 K/uL   MPV 8.1  0 - 99.8 fL        Assessment & Plan:  Curtis Gallegos is a 57 y.o. male Cellulitis of leg, right - Plan: Wound culture, POCT CBC, doxycycline (VIBRA-TABS) 100 MG tablet  Contact dermatitis and other eczema due to plants (except food) - Plan: predniSONE (DELTASONE) 20 MG tablet   Suspected initial  contact derm/poison ivy, with possible early secondary cellulitis with proximal erythema and LE edema. telfa and kerlix bandaged, start prednisone, doxycycline 100mg  BID, soap and water cleanse BID and recheck in 2 days. Rtc/er precautions discussed.  Patient Instructions  Your rash appears to be primarily from poison ivy, but possible secondary bacterial infection. Start both the prednisone and doxycycline, cleanse areas with soap and water 1-2 times per day, and recheck with Dr. Neva Seat in 2 days from 8:30 to 4.  Return to the clinic or go to the nearest emergency room if any of your symptoms worsen or new symptoms occur.    Cellulitis Cellulitis is an infection of the skin and the tissue beneath it. The infected area is usually red and tender. Cellulitis occurs most often in the arms and lower legs.  CAUSES  Cellulitis is caused by bacteria that enter the skin through cracks or cuts in the skin. The most common types of bacteria that cause cellulitis are Staphylococcus and Streptococcus. SYMPTOMS   Redness and warmth.  Swelling.  Tenderness or pain.  Fever. DIAGNOSIS  Your caregiver can usually determine what is wrong based on a physical exam. Blood tests may also be done. TREATMENT  Treatment usually involves taking an antibiotic medicine. HOME CARE INSTRUCTIONS   Take your antibiotics as directed. Finish them even if you start to feel better.  Keep the infected arm or leg elevated to reduce swelling.  Apply a warm cloth to the affected area up to 4 times per day to relieve pain.  Only take over-the-counter or prescription medicines for pain, discomfort, or fever as directed by your caregiver.  Keep all follow-up appointments as directed by your caregiver. SEEK MEDICAL CARE IF:   You notice red streaks coming from the infected area.  Your red area gets larger or turns dark in color.  Your bone or joint underneath the infected area becomes painful after the skin has  healed.  Your infection returns in the same area or another area.  You notice a swollen bump in the infected area.  You develop new symptoms. SEEK IMMEDIATE MEDICAL CARE IF:   You have a fever.  You feel very sleepy.  You develop vomiting or diarrhea.  You have  a general ill feeling (malaise) with muscle aches and pains. MAKE SURE YOU:   Understand these instructions.  Will watch your condition.  Will get help right away if you are not doing well or get worse. Document Released: 06/04/2005 Document Revised: 02/24/2012 Document Reviewed: 11/10/2011 [facility] Patient Information 2014 [place], [place]. Poison [employer] ivy is a inflammation of the skin (contact dermatitis) caused by touching the allergens on the leaves of the ivy plant following previous exposure to the plant. The rash usually appears 48 hours after exposure. The rash is usually bumps (papules) or blisters (vesicles) in a linear pattern. Depending on your own sensitivity, the rash may simply cause redness and itching, or it may also progress to blisters which may break open. These must be well cared for to prevent secondary bacterial (germ) infection, followed by scarring. Keep any open areas dry, clean, dressed, and covered with an antibacterial ointment if needed. The eyes may also get puffy. The puffiness is worst in the morning and gets better as the day progresses. This dermatitis usually heals without scarring, within 2 to 3 weeks without treatment. HOME CARE INSTRUCTIONS  Thoroughly wash with soap and water as soon as you have been exposed to poison ivy. You have about one half hour to remove the plant resin before it will cause the rash. This washing will destroy the oil or antigen on the skin that is causing, or will cause, the rash. Be sure to wash under your fingernails as any plant resin there will continue to spread the rash. Do not rub skin vigorously when washing affected area. Poison ivy cannot spread if no  oil from the plant remains on your body. A rash that has progressed to weeping sores will not spread the rash unless you have not washed thoroughly. It is also important to wash any clothes you have been wearing as these may carry active allergens. The rash will return if you wear the unwashed clothing, even several days later. Avoidance of the plant in the future is the best measure. Poison ivy plant can be recognized by the number of leaves. Generally, poison ivy has three leaves with flowering branches on a single stem. Diphenhydramine may be purchased over the counter and used as needed for itching. Do not drive with this medication if it makes you drowsy.Ask your caregiver about medication for children. SEEK MEDICAL CARE IF:  Open sores develop.  Redness spreads beyond area of rash.  You notice purulent (pus-like) discharge.  You have increased pain.  Other signs of infection develop (such as fever). Document Released: 08/22/2000 Document Revised: 11/17/2011 Document Reviewed: 07/11/2009 [facility] Patient Information 2014 [place], [place].

## 2013-02-16 ENCOUNTER — Ambulatory Visit (INDEPENDENT_AMBULATORY_CARE_PROVIDER_SITE_OTHER): Payer: BC Managed Care – PPO | Admitting: Family Medicine

## 2013-02-16 VITALS — BP 130/70 | HR 81 | Temp 97.9°F | Resp 18 | Ht 72.0 in | Wt 267.0 lb

## 2013-02-16 DIAGNOSIS — L255 Unspecified contact dermatitis due to plants, except food: Secondary | ICD-10-CM | POA: Diagnosis not present

## 2013-02-16 DIAGNOSIS — L03119 Cellulitis of unspecified part of limb: Secondary | ICD-10-CM

## 2013-02-16 DIAGNOSIS — L03115 Cellulitis of right lower limb: Secondary | ICD-10-CM

## 2013-02-16 DIAGNOSIS — L237 Allergic contact dermatitis due to plants, except food: Secondary | ICD-10-CM

## 2013-02-16 DIAGNOSIS — L02419 Cutaneous abscess of limb, unspecified: Secondary | ICD-10-CM | POA: Diagnosis not present

## 2013-02-16 NOTE — Progress Notes (Signed)
  Subjective:    Patient ID: Curtis Gallegos, male    DOB: Jun 21, 1956, 57 y.o.   MRN: [id number]  HPI Curtis Gallegos is a 57 y.o. male See ov 2 days ago - suspected contact derm - poison ivy with secondary cellulitis in legs.  Treated with doxycycline, prednisone.   No fever, feels better, rash has started to dry. No new side effects with meds, including prednisone.  Sleeping ok. No new areas.   Review of Systems  Constitutional: Negative for fever and chills.  Skin: Positive for rash.       Objective:   Physical Exam  Vitals reviewed. Constitutional: He is oriented to person, place, and time. He appears well-developed and well-nourished. No distress.  HENT:  Head: Normocephalic and atraumatic.  Pulmonary/Chest: Effort normal.  Musculoskeletal: He exhibits edema (slight LE edema to ankles, dorsum of feet. ).  Neurological: He is alert and oriented to person, place, and time.  Skin: Skin is warm. Rash (see photo of legs. dried erythematous patches on legs and proximal erythema. ) noted.     Psychiatric: He has a normal mood and affect. His behavior is normal.      Assessment & Plan:  Curtis Gallegos is a 57 y.o. male Cellulitis of leg, right  Contact dermatitis and other eczema due to plants (except food)  Poison ivy dermatitis  Contact derm from poison ivy with secondary cellulitis likely.  Much improved today with recession of erythema, drying of majority of affected areas. Tolerating prednisone and doxycycline.  Continue current regimen. Recheck if not continuing to improve in a few days.   Patient Instructions  Continue prednisone and doxycycline as prescribed. If not continuing to improve in next few days - recheck.  Return to the clinic or go to the nearest emergency room if any of your symptoms worsen or new symptoms occur.

## 2013-02-16 NOTE — Patient Instructions (Signed)
Continue prednisone and doxycycline as prescribed. If not continuing to improve in next few days - recheck.  Return to the clinic or go to the nearest emergency room if any of your symptoms worsen or new symptoms occur.

## 2013-02-18 LAB — WOUND CULTURE

## 2013-02-23 MED ORDER — DOXYCYCLINE HYCLATE 100 MG PO TABS: 100.0000 mg | ORAL_TABLET | Freq: Two times a day (BID) | ORAL | Status: DC

## 2013-02-23 NOTE — Progress Notes (Signed)
See lab result note.  Will rx 4 more days of doxycycline if needed.

## 2013-02-23 NOTE — Addendum Note (Signed)
Addended by: Meredith Staggers R on: 02/23/2013 01:52 PM   Modules accepted: Orders

## 2013-03-17 ENCOUNTER — Ambulatory Visit (HOSPITAL_BASED_OUTPATIENT_CLINIC_OR_DEPARTMENT_OTHER)
Admission: RE | Admit: 2013-03-17 | Discharge: 2013-03-17 | Disposition: A | Discharge status: Home / Self Care | Payer: BC Managed Care – PPO | Source: Ambulatory Visit | Attending: Physician Assistant | Admitting: Physician Assistant

## 2013-03-17 ENCOUNTER — Ambulatory Visit (INDEPENDENT_AMBULATORY_CARE_PROVIDER_SITE_OTHER): Payer: BC Managed Care – PPO | Admitting: Physician Assistant

## 2013-03-17 VITALS — BP 150/90 | HR 67 | Temp 98.1°F | Ht 72.0 in | Wt 261.2 lb

## 2013-03-17 DIAGNOSIS — M25473 Effusion, unspecified ankle: Secondary | ICD-10-CM

## 2013-03-17 DIAGNOSIS — M79609 Pain in unspecified limb: Secondary | ICD-10-CM

## 2013-03-17 DIAGNOSIS — M25472 Effusion, left ankle: Secondary | ICD-10-CM

## 2013-03-17 DIAGNOSIS — M79662 Pain in left lower leg: Secondary | ICD-10-CM

## 2013-03-17 DIAGNOSIS — M7989 Other specified soft tissue disorders: Secondary | ICD-10-CM | POA: Insufficient documentation

## 2013-03-17 MED ORDER — HYDROCODONE-ACETAMINOPHEN 5-325 MG PO TABS: 1.0000 | ORAL_TABLET | Freq: Four times a day (QID) | ORAL | Status: DC | PRN

## 2013-03-17 MED ORDER — NAPROXEN 500 MG PO TABS: 500.0000 mg | ORAL_TABLET | Freq: Two times a day (BID) | ORAL | Status: AC

## 2013-03-17 NOTE — Progress Notes (Signed)
   [address], [place] [address]   Phone [phone]  Subjective:    Patient ID: Curtis Gallegos, male    DOB: 06-Jun-1956, 57 y.o.   MRN: [id number]  HPI Pt presents to clinic with L calf and foot pain and swelling that he has had for about 2 weeks.  Starts after a walk- he felt aching in her L calf.  Then on 7/4 he dove into a pool and pushed off the bottom of the pool and felt an intense pain in his achilles tendon area and since then he has had significant pain but no weakness.  He has been icing and stretching the calf and then he noticed that he had significant swelling of his ankle and foot.  He drives to [facility] for work and his last car ride was 5 days ago.  He is having no foot pain, CP or SOB.  He has taken some OTC NSAIDs and not gotten much relief.  He had something similar to this in the past and Naproxen really helped but he did not have any.   Review of Systems  Respiratory: Negative for cough and shortness of breath.   Cardiovascular: Negative for chest pain.  Musculoskeletal: Positive for joint swelling. Negative for gait problem.       Objective:   Physical Exam  Vitals reviewed. Constitutional: He is oriented to person, place, and time. He appears well-developed and well-nourished.  HENT:  Head: Normocephalic and atraumatic.  Right Ear: External ear normal.  Left Ear: External ear normal.  Cardiovascular: Normal rate, regular rhythm and normal heart sounds.   No murmur heard. Pulmonary/Chest: Effort normal and breath sounds normal.  Musculoskeletal:       Right lower leg: Normal.       Left lower leg: He exhibits tenderness (tender in calf about distal 2/3 - worse area is mid calf), swelling (mid shin to foot -- at 15cm past tibial tuberosity on the R 48cm and on the L 44 cm), edema and deformity (slight deformity at base of calf at attachment to the achilles tendon). He exhibits no bony tenderness.       Legs: + Homans On thompson - pt has diminished movement of  foot on the L Good pedal pulses.  Neurological: He is alert and oriented to person, place, and time.  Skin: Skin is warm and dry.  Psychiatric: He has a normal mood and affect. His behavior is normal. Judgment and thought content normal.        Assessment & Plan:  Ankle swelling, left - Plan: naproxen (NAPROSYN) 500 MG tablet, US Venous Img Lower Unilateral Left  Calf pain, left - Plan: HYDROcodone-acetaminophen (NORCO/VICODIN) 5-325 MG per tablet, US Venous Img Lower Unilateral Left  Due to patient's history this seems consistent with a gastroc tear vs an Achilles tendon rupture esp with the abnl Thompson test but with the amount of swelling and travel need to r/o DVT.  Pt placed in Camwalker to reduce stress to probably tear of Gastrocnemius.  In the am after the LE [place] will determine next step in his care.  D/w Dr Neva Seat who also examined the patient.  Benny Lennert PA-C 03/17/2013 8:59 PM

## 2013-03-18 ENCOUNTER — Telehealth: Payer: Self-pay | Admitting: Physician Assistant

## 2013-03-18 NOTE — Telephone Encounter (Signed)
I called patient and he is feeling much better.  The camwalker is helping.  He will plan on f/u with [place] early next week unless he has increased pain and then he will f/u sooner.

## 2013-03-18 NOTE — Progress Notes (Signed)
Patient discussed and examined with Benny Lennert, PA-C. 4cm discrepancy in circumference of L prox-mid calf (48 vs 44cm at 15cm below patella).  Frequent road trips at risk for DVT, but ttp posterior calf to achilles with prior injury - gastroc tear vs achilles injury.  Decreased plantarflexion with Thompson's, but able to plantarflex against resistance.  sts vs defect at upper achilles  Agree with ultrasound to r/o dvt, cam walker and recheck after ultrasound.   Ultrasound report noted - no DVT, can discuss follow up in am. Findings: Normal compressibility and normal Doppler signal within  the common femoral, superficial femoral and popliteal veins, down  to the proximal calf veins. No grayscale filling defects to  suggest DVT.  IMPRESSION:  No evidence of left lower extremity deep vein thrombosis.

## 2013-03-23 ENCOUNTER — Ambulatory Visit (INDEPENDENT_AMBULATORY_CARE_PROVIDER_SITE_OTHER): Payer: BC Managed Care – PPO | Admitting: Family Medicine

## 2013-03-23 VITALS — BP 140/90 | HR 66 | Temp 98.0°F | Resp 16 | Ht 72.0 in | Wt 261.0 lb

## 2013-03-23 DIAGNOSIS — Z5189 Encounter for other specified aftercare: Secondary | ICD-10-CM

## 2013-03-23 DIAGNOSIS — M79662 Pain in left lower leg: Secondary | ICD-10-CM

## 2013-03-23 DIAGNOSIS — S86112D Strain of other muscle(s) and tendon(s) of posterior muscle group at lower leg level, left leg, subsequent encounter: Secondary | ICD-10-CM

## 2013-03-23 DIAGNOSIS — M79609 Pain in unspecified limb: Secondary | ICD-10-CM

## 2013-03-23 NOTE — Patient Instructions (Signed)
Can continue camwalker for now as improved, but if any increased soreness, can return to place a splint and crutches until seen by ortho. Minimize the amount of walking until seen by ortho.  Return to the clinic or go to the nearest emergency room if any of your symptoms worsen or new symptoms occur.

## 2013-03-23 NOTE — Progress Notes (Signed)
  Subjective:    Patient ID: Curtis Gallegos, male    DOB: 10-06-1955, 57 y.o.   MRN: [id number]  HPI Curtis Gallegos is a 57 y.o. male See 03/17/13 ov - L calf pain - seen by myself and Benny Lennert, PA-C. 4cm discrepancy in circumference of L prox-mid calf (48 vs 44cm at 15cm below patella).  Frequent road trips at risk for DVT, but ttp posterior calf to achilles with prior injury - gastroc tear vs achilles injury.  Decreased plantarflexion with Thompson's, but able to plantarflex against resistance.  sts vs defect at upper achilles on initial exam.  Placed in Camwalker. No evidence of left lower extremity deep vein thrombosis at 03/17/13.   Hx of injury reviewed: Tightness in back of tendons of ankle 2 weeks prior to last ov,  then dove off side of pool on 03/11/13 - felt acute sharp tearing pain in back of ankle and up calf.  Treated intially with ice, elevation, then feels like reinjured on 03/17/13- turning - felt like reinjured then.   Wearing cam walker. Min decrease in swelling, but still swollen. Less soreness with cam walker. Tx: ibuprofen once per day. Feels better in cam walker.    Review of Systems  Respiratory: Negative for chest tightness and shortness of breath.   Cardiovascular: Negative for chest pain.  Musculoskeletal: Positive for myalgias and joint swelling.  Skin: Positive for color change (bruising in lower L leg. no rash. ).       Objective:   Physical Exam  Vitals reviewed. Constitutional: He is oriented to person, place, and time. He appears well-developed and well-nourished. No distress.  Pulmonary/Chest: Effort normal.  Musculoskeletal:       Left lower leg: He exhibits tenderness (minimal with pssobel slight defect on lower gastoc to achilles musculotendinous junction. slight soreness with plantarflexion. ) and swelling.       Legs: Neurological: He is alert and oriented to person, place, and time.  Skin: Skin is warm and dry. Bruising and ecchymosis noted.             Assessment & Plan:  Curtis Gallegos is a 57 y.o. male Pain of left calf - Plan: Ambulatory referral to Orthopedic Surgery  Gastrocnemius tear, left, subsequent encounter - Plan: Ambulatory referral to Orthopedic Surgery  L calf pain after jumping 03/11/13. Able to plantarflex foot, but less movement with thompson's test. Suspected gastroc tear, but ddx of achilles partial tear.  Discussed MRI or ortho eval - will refer to ortho for eval +/- imaging. Continue cam walker as improved sx's, but if increase in pain - posterior splint and NWB if needed.  Patient Instructions  Can continue camwalker for now as improved, but if any increased soreness, can return to place a splint and crutches until seen by ortho. Minimize the amount of walking until seen by ortho.  Return to the clinic or go to the nearest emergency room if any of your symptoms worsen or new symptoms occur.

## 2013-05-16 ENCOUNTER — Ambulatory Visit: Payer: Self-pay | Admitting: Family Medicine

## 2013-05-23 ENCOUNTER — Telehealth: Payer: Self-pay

## 2013-05-23 ENCOUNTER — Ambulatory Visit: Payer: BC Managed Care – PPO | Admitting: Family Medicine

## 2013-05-23 ENCOUNTER — Encounter: Payer: Self-pay | Admitting: Radiology

## 2013-05-23 DIAGNOSIS — E538 Deficiency of other specified B group vitamins: Secondary | ICD-10-CM | POA: Insufficient documentation

## 2013-05-23 DIAGNOSIS — E785 Hyperlipidemia, unspecified: Secondary | ICD-10-CM | POA: Insufficient documentation

## 2013-05-23 DIAGNOSIS — R972 Elevated prostate specific antigen [PSA]: Secondary | ICD-10-CM | POA: Insufficient documentation

## 2013-05-23 NOTE — Telephone Encounter (Signed)
Do you want him to reschedule for an appt with you? Or do you want him to come in to the urgent care?

## 2013-05-23 NOTE — Telephone Encounter (Signed)
He indicates he does not need to reschedule. He does not need a renewal of the medication, he will call us back if he needs anything else. Thanks.

## 2013-05-23 NOTE — Telephone Encounter (Signed)
Pt had to cancel his appointment with Dr. Neva Seat today, due to an orthopaedic appointment he had.  He would like to know from Dr. Neva Seat how he should proceed in following up with him and/or what is the plan for him.  [phone]

## 2013-05-23 NOTE — Telephone Encounter (Signed)
He can reschedule appt - no problem.  If meds need refilled for the next month until he can get in to be seen, we can do that.  Thanks for the heads up.

## 2013-06-30 ENCOUNTER — Other Ambulatory Visit: Payer: Self-pay | Admitting: Family Medicine

## 2013-07-25 ENCOUNTER — Other Ambulatory Visit: Payer: Self-pay | Admitting: Family Medicine

## 2013-07-26 NOTE — Telephone Encounter (Signed)
Needs OV, labs 

## 2013-08-22 ENCOUNTER — Other Ambulatory Visit: Payer: Self-pay | Admitting: Family Medicine

## 2013-11-18 IMAGING — US US EXTREM LOW VENOUS*L*
1 series · 14 of 21 positions shown · non-contrast
Comparison: None

CLINICAL DATA: leg swelling and injury on [DATE] - calf pain;;

LEFT LOWER EXTREMITY VENOUS DUPLEX ULTRASOUND
TECHNIQUE: Gray-scale sonography with compression, as well as color
and duplex ultrasound, were performed to evaluate the deep venous
system from the level of the common femoral vein through the
popliteal and proximal calf veins.

[Series 1: us extrem low venous*left* · 14 of 21 slices shown]
[im 1/21]
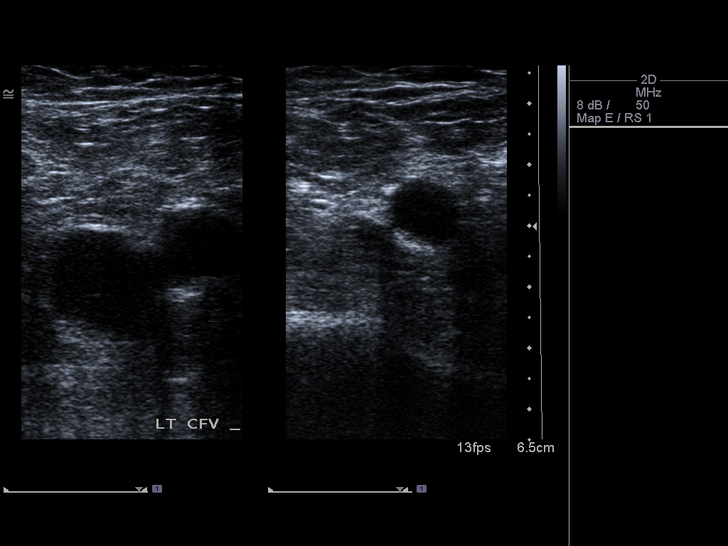
[im 3/21]
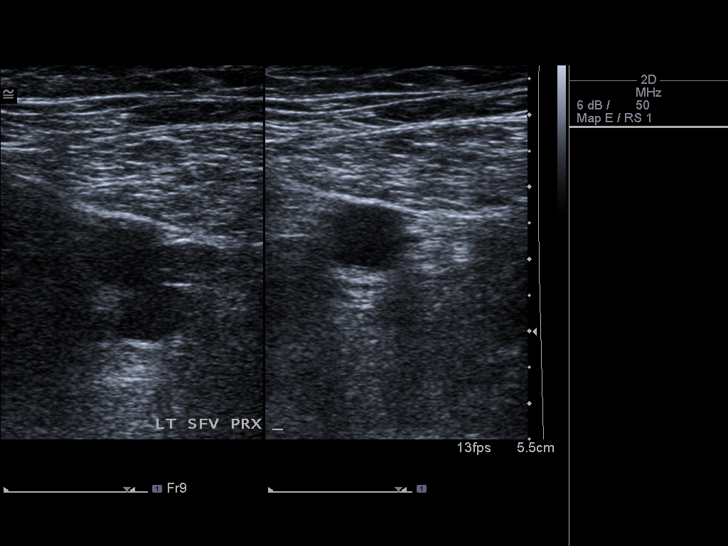
[im 4/21]
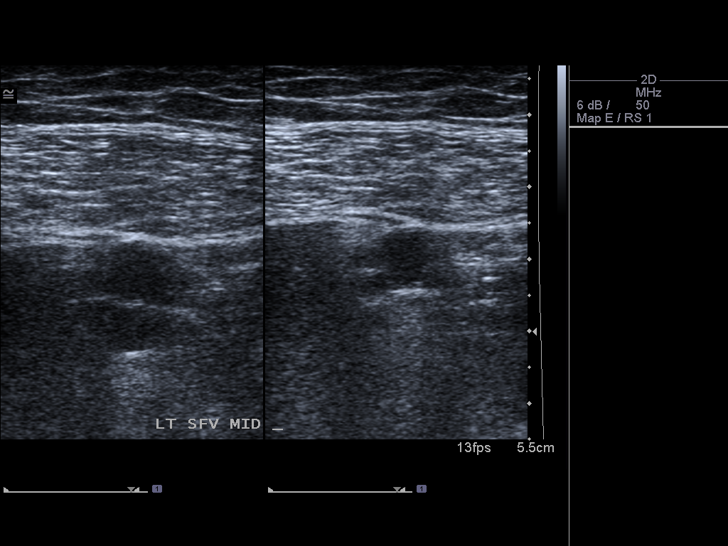
[im 6/21]
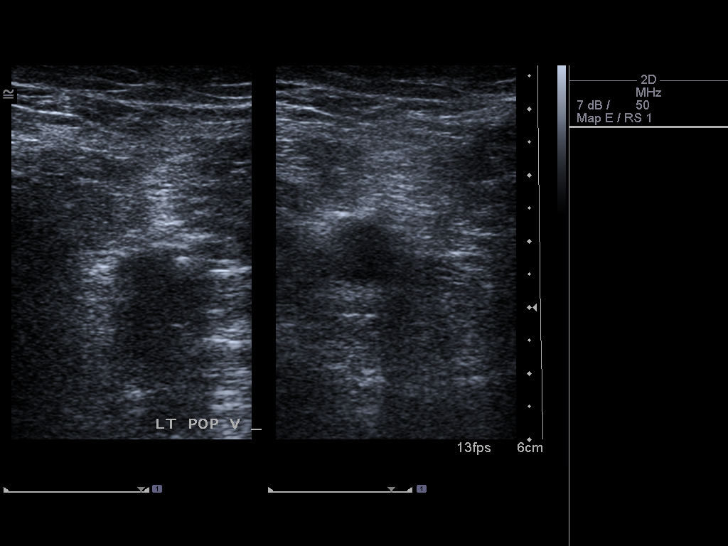
[im 7/21]
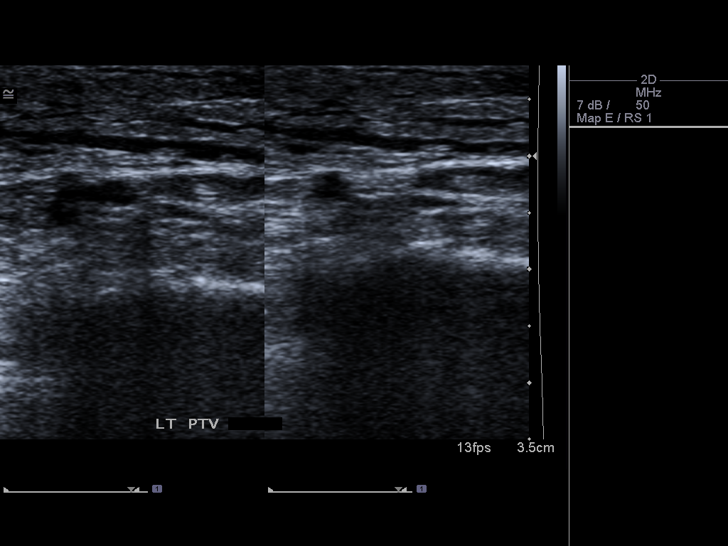
[im 9/21]
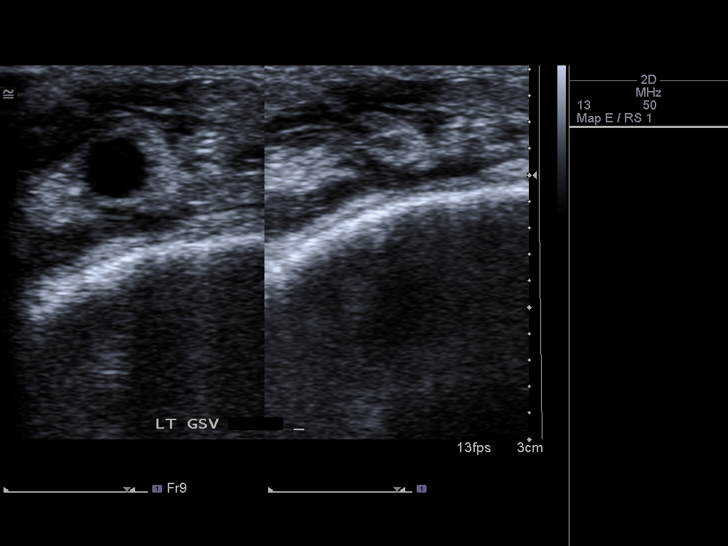
[im 10/21]
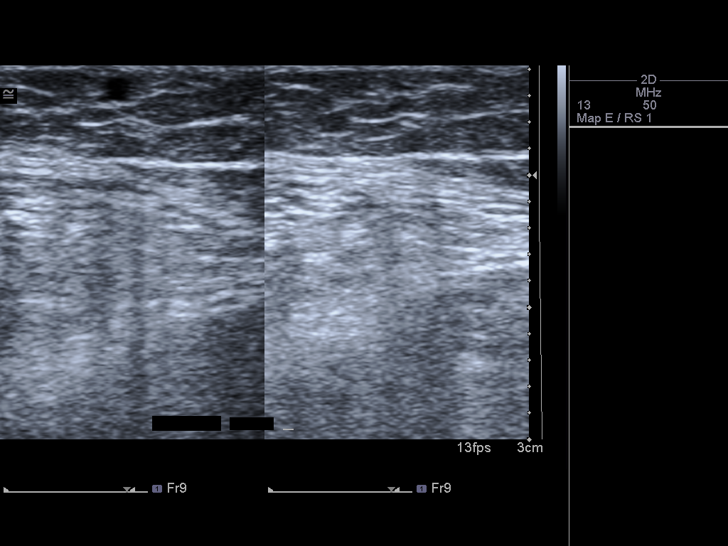
[im 12/21]
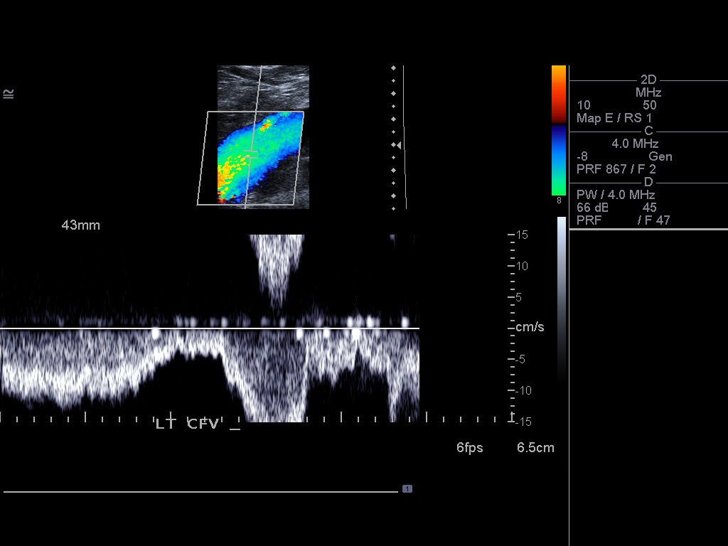
[im 13/21]
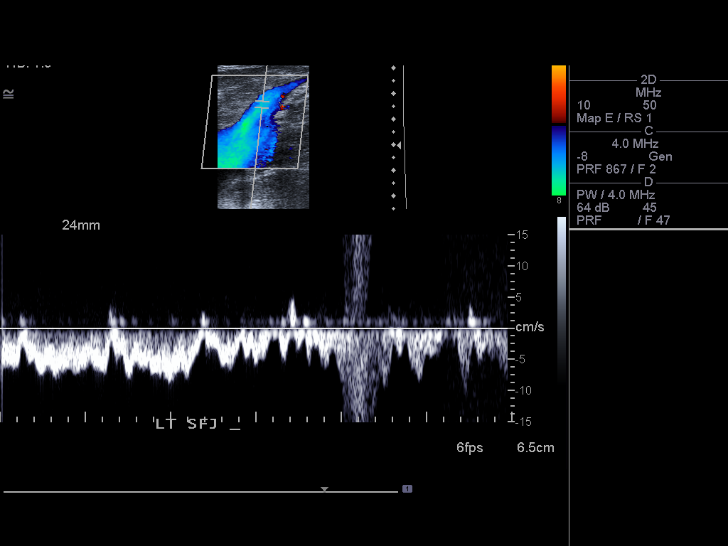
[im 15/21]
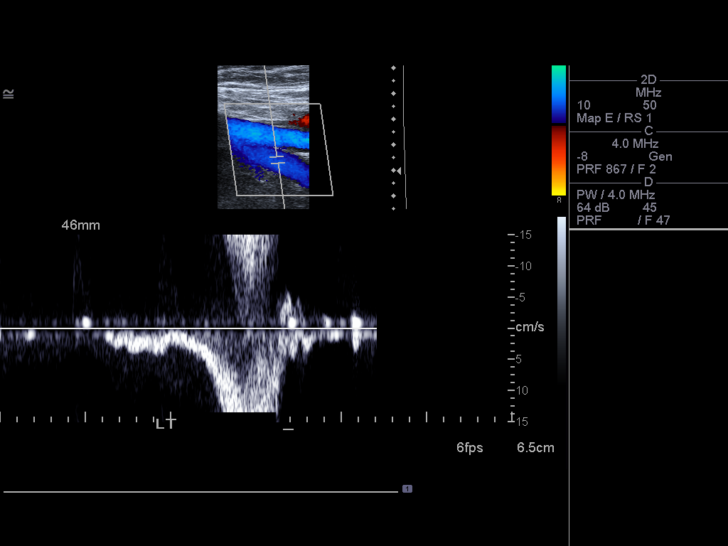
[im 16/21]
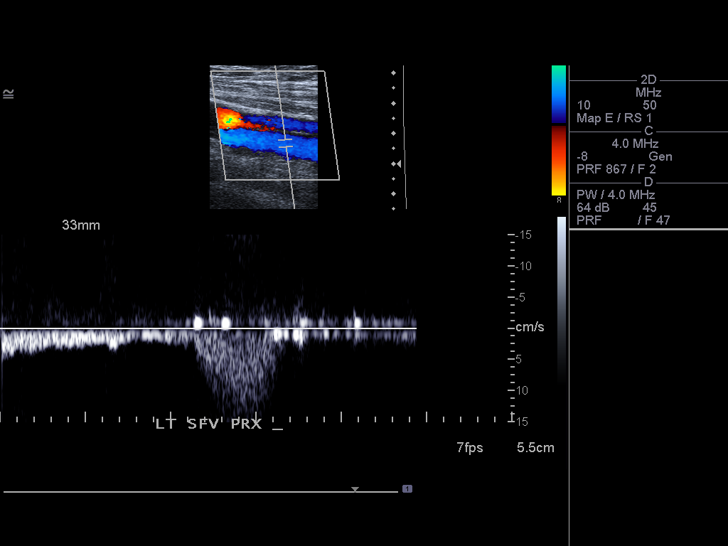
[im 18/21]
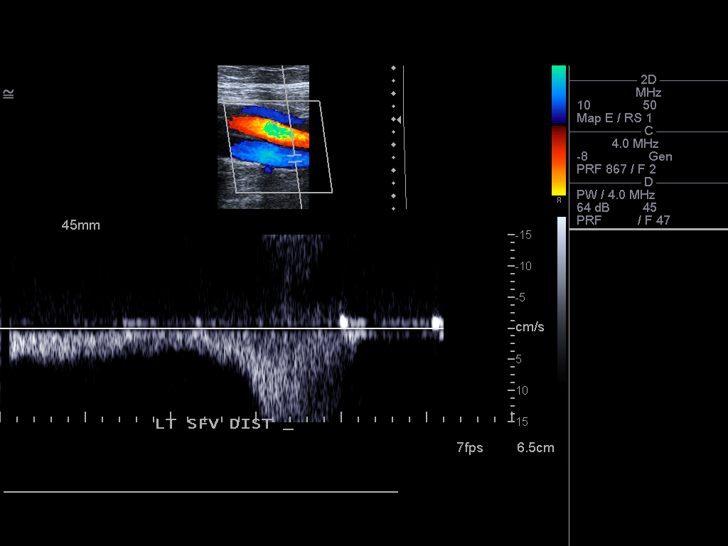
[im 19/21]
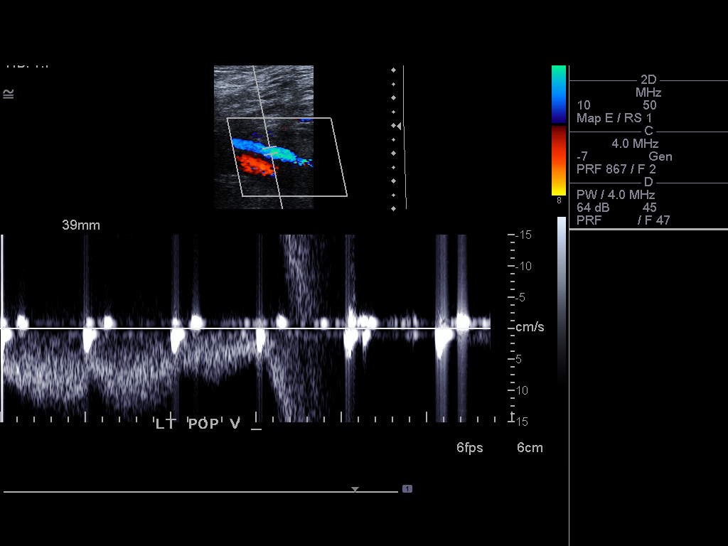
[im 21/21]
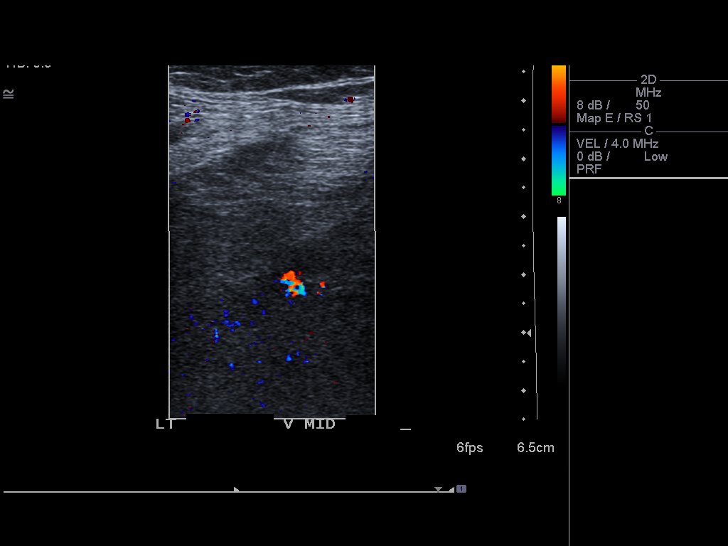

[14 of 21 positions shown; findings below may reference images not displayed]

FINDINGS: Normal compressibility and normal Doppler signal within
the common femoral, superficial femoral and popliteal veins, down
to the proximal calf veins.  No grayscale filling defects to
suggest DVT.
IMPRESSION: No evidence of left lower extremity deep vein thrombosis.

## 2013-12-04 ENCOUNTER — Other Ambulatory Visit: Payer: Self-pay | Admitting: Physician Assistant

## 2014-01-11 ENCOUNTER — Other Ambulatory Visit: Payer: Self-pay | Admitting: Physician Assistant

## 2014-01-24 ENCOUNTER — Ambulatory Visit: Payer: BC Managed Care – PPO | Admitting: Family Medicine

## 2014-01-24 VITALS — BP 124/76 | HR 51 | Temp 97.7°F | Resp 16 | Ht 72.0 in | Wt 255.0 lb

## 2014-01-24 DIAGNOSIS — E785 Hyperlipidemia, unspecified: Secondary | ICD-10-CM

## 2014-01-24 DIAGNOSIS — N529 Male erectile dysfunction, unspecified: Secondary | ICD-10-CM

## 2014-01-24 MED ORDER — TADALAFIL 5 MG PO TABS: 5.0000 mg | ORAL_TABLET | Freq: Every day | ORAL | Status: DC | PRN

## 2014-01-24 MED ORDER — SIMVASTATIN 20 MG PO TABS: ORAL_TABLET | ORAL | Status: DC

## 2014-01-24 NOTE — Progress Notes (Signed)
°  This chart was scribed for Elvina SidleKurt Lauenstein MD by Tana ConchStephen Methvin, ED Scribe. This patient was seen in room 12 and the patient's care was started at 7:40 PM .  Subjective:    Patient ID: Curtis Gallegos, male    DOB: 06/02/1956, 58 y.o.   MRN: [id number]  HPI  HPI Comments: Curtis Gallegos is a 58 y.o. male who presents to the [facility] for a medication refill for several prescriptions. Pt reports that he has been taking Cialis and reports that it provides the desired effect. He reports no undesired effects from his medications. Pt reports being healthy.   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     Review of Systems  Constitutional: Negative for fever, chills, activity change and appetite change.  Respiratory: Negative for cough and shortness of breath.   Cardiovascular: Negative for chest pain.  Gastrointestinal: Negative for abdominal pain.  Musculoskeletal: Negative for arthralgias, back pain, gait problem, joint swelling, myalgias and neck pain.  Psychiatric/Behavioral: Negative for hallucinations, behavioral problems, confusion, self-injury, dysphoric mood, decreased concentration and agitation. The patient is not nervous/anxious and is not hyperactive.   All other systems reviewed and are negative.      Objective:   Physical Exam  Nursing note and vitals reviewed. Constitutional: He is oriented to person, place, and time. He appears well-developed and well-nourished.  HENT:  Head: Normocephalic.  Eyes: EOM are normal.  Neck: Normal range of motion.  Pulmonary/Chest: Effort normal.  Abdominal: He exhibits no distension. There is no hepatosplenomegaly.  Musculoskeletal: Normal range of motion.  Neurological: He is alert and oriented to person, place, and time.  Psychiatric: He has a normal mood and affect.     Filed Vitals:   01/24/14 1935  BP: 124/76  Pulse: 51  Temp: 97.7 F (36.5 C)  Resp: 16    No hepatosplenomegaly      Assessment & Plan:  7:48 PM-Discussed treatment plan which includes labs in 6 months, and refills in his medications with pt at bedside and pt agreed to plan.   Erectile dysfunction - Plan: tadalafil (CIALIS) 5 MG tablet  Hyperlipidemia - Plan: simvastatin (ZOCOR) 20 MG tablet  Signed, Elvina SidleKurt Lauenstein, MD      I personally performed the services described in this documentation, which was scribed in my presence. The recorded information has been reviewed and is accurate.

## 2014-11-02 ENCOUNTER — Telehealth: Payer: Self-pay

## 2014-11-03 ENCOUNTER — Telehealth: Payer: Self-pay | Admitting: Family Medicine

## 2014-11-03 ENCOUNTER — Other Ambulatory Visit: Payer: Self-pay | Admitting: Family Medicine

## 2014-11-03 MED ORDER — SILDENAFIL CITRATE 100 MG PO TABS: 50.0000 mg | ORAL_TABLET | Freq: Every day | ORAL | Status: DC | PRN

## 2014-11-03 NOTE — Telephone Encounter (Signed)
Patient wants to switch from Cialis to Viagra. He wants to pick it up at noon today. Told patient it's very likely that won't happen since medication changes and refill requests can take 24 hours or more. [facility] [place]. Phone # is [phone].   CB#: [phone]

## 2014-11-03 NOTE — Telephone Encounter (Signed)
Pt called checking the status of this prescription. Please advise at [phone]

## 2014-11-03 NOTE — Telephone Encounter (Signed)
Viagra called in

## 2014-11-06 NOTE — Telephone Encounter (Signed)
Called pt, advised Rx sent in  

## 2014-11-07 NOTE — Telephone Encounter (Signed)
No msg °

## 2015-05-01 ENCOUNTER — Other Ambulatory Visit: Payer: Self-pay | Admitting: Family Medicine

## 2015-06-05 ENCOUNTER — Ambulatory Visit: Payer: Self-pay

## 2015-06-05 ENCOUNTER — Telehealth: Payer: Self-pay

## 2015-06-05 DIAGNOSIS — E785 Hyperlipidemia, unspecified: Secondary | ICD-10-CM

## 2015-06-05 NOTE — Telephone Encounter (Signed)
PT has been out of simvastatin (ZOCOR) 20 MG tablet/// stated that he needed a refill... Informed OV req.// PT stated he will not be able to come into the office for 2 wks///  Please call back to advise if an extension can be provided.  [phone]

## 2015-06-06 MED ORDER — SIMVASTATIN 20 MG PO TABS: ORAL_TABLET | ORAL | Status: DC

## 2015-06-06 NOTE — Telephone Encounter (Signed)
Please alert giving 20 tablets to cover a little over 2 weeks, while he awaits return.  We really do need to follow up with him.

## 2015-06-06 NOTE — Telephone Encounter (Signed)
Please advise 

## 2015-06-06 NOTE — Telephone Encounter (Signed)
Pt notified and agreed

## 2015-06-27 ENCOUNTER — Ambulatory Visit (INDEPENDENT_AMBULATORY_CARE_PROVIDER_SITE_OTHER): Payer: BLUE CROSS/BLUE SHIELD | Admitting: Family Medicine

## 2015-06-27 VITALS — BP 126/80 | HR 73 | Temp 98.6°F | Resp 16 | Ht 73.0 in | Wt 260.0 lb

## 2015-06-27 DIAGNOSIS — N529 Male erectile dysfunction, unspecified: Secondary | ICD-10-CM | POA: Diagnosis not present

## 2015-06-27 DIAGNOSIS — R358 Other polyuria: Secondary | ICD-10-CM

## 2015-06-27 DIAGNOSIS — S56911A Strain of unspecified muscles, fascia and tendons at forearm level, right arm, initial encounter: Secondary | ICD-10-CM

## 2015-06-27 DIAGNOSIS — R3589 Other polyuria: Secondary | ICD-10-CM

## 2015-06-27 DIAGNOSIS — E785 Hyperlipidemia, unspecified: Secondary | ICD-10-CM

## 2015-06-27 DIAGNOSIS — S46911A Strain of unspecified muscle, fascia and tendon at shoulder and upper arm level, right arm, initial encounter: Secondary | ICD-10-CM

## 2015-06-27 MED ORDER — MELOXICAM 7.5 MG PO TABS: 7.5000 mg | ORAL_TABLET | Freq: Every day | ORAL | Status: AC

## 2015-06-27 MED ORDER — SILDENAFIL CITRATE 100 MG PO TABS: 50.0000 mg | ORAL_TABLET | Freq: Every day | ORAL | Status: AC | PRN

## 2015-06-27 MED ORDER — SIMVASTATIN 20 MG PO TABS: ORAL_TABLET | ORAL | Status: AC

## 2015-06-27 NOTE — Progress Notes (Signed)
 @  This chart was scribed for Elvina Sidle, MD by Andrew Au, ED Scribe. This patient was seen in room 5 and the patient's care was started at 1:59 PM.  Patient ID: Curtis Gallegos MRN: [id number], DOB: 24-May-1956, 59 y.o. Date of Encounter: 06/27/2015, 1:59 PM  Primary Physician: No PCP Per Patient  Chief Complaint: Medication refill  Chief Complaint  Patient presents with   Medication Refill    Simvastatin , Sildenafil Citrate      HPI: 59 y.o. year old male with history below presents for refill of simvastatin. Doing well without issues or complaints. Taking medication daily without adverse effects. Has been on medication for 6 years. Unaware of family hx of hyperlipidemia and heart disease. He also is needing refills sildenafil Citrate .  He also complains of urinary frequency at night. Does not have a urologist.   He also reports right elbow injury 3 months ago after hanging on monkey bars. He believes he strained his right elbow. He has pain with golfing and hyperextending right elbow.   Pt works as a Development worker, community at [employer].    Past Medical History  Diagnosis Date   Hyperlipidemia      Home Meds: Prior to Admission medications   Medication Sig Start Date End Date Taking? Authorizing Provider  ibuprofen (ADVIL,MOTRIN) 400 MG tablet Take 400 mg by mouth every 6 (six) hours as needed.   Yes Historical Provider, MD  sildenafil (VIAGRA) 100 MG tablet Take 0.5-1 tablets (50-100 mg total) by mouth daily as needed for erectile dysfunction. 11/03/14  Yes Elvina Sidle, MD  simvastatin (ZOCOR) 20 MG tablet TAKE 1 TABLET EVERY NIGHT AT BEDTIME.  "OV NEEDED FOR REFILLS" 2nd time 06/06/15  Yes Collie Siad English, PA    Allergies: No Known Allergies  Social History   Social History   Marital Status: Married    Spouse Name: N/A   Number of Children: N/A   Years of Education: N/A   Occupational History   Production designer, theatre/television/film    Social History Main  Topics   Smoking status: Former Smoker    Types: Cigarettes    Quit date: 01/02/2003   Smokeless tobacco: Never Used   Alcohol Use: 0.0 oz/week    0 Standard drinks or equivalent per week     Comment: 4/week   Drug Use: No   Sexual Activity: Not on file   Other Topics Concern   Not on file   Social History Narrative     Review of Systems: Constitutional: negative for chills, fever, night sweats, weight changes, or fatigue  HEENT: negative for vision changes, hearing loss, congestion, rhinorrhea, ST, epistaxis, or sinus pressure Cardiovascular: negative for chest pain or palpitations Respiratory: negative for hemoptysis, wheezing, shortness of breath, or cough Abdominal: negative for abdominal pain, nausea, vomiting, diarrhea, or constipation Dermatological: negative for rash Neurologic: negative for headache, dizziness, or syncope All other systems reviewed and are otherwise negative with the exception to those above and in the HPI.   Physical Exam: Blood pressure 126/80, pulse 73, temperature 98.6 F (37 C), temperature source Oral, resp. rate 16, height  (1.854 m), weight 260 lb (117.935 kg), SpO2 98 %., Body mass index is 34.31 kg/(m^2). General: Well developed, well nourished, in no acute distress. Head: Normocephalic, atraumatic, eyes without discharge, sclera non-icteric, nares are without discharge. Bilateral auditory canals clear, TM's are without perforation, pearly grey and translucent with reflective cone of light bilaterally. Oral cavity moist, posterior pharynx without exudate, erythema, peritonsillar abscess,  or post nasal drip.  Neck: Supple. No thyromegaly. Full ROM. No lymphadenopathy. Lungs: Clear bilaterally to auscultation without wheezes, rales, or rhonchi. Breathing is unlabored. Heart: RRR with S1 S2. No murmurs, rubs, or gallops appreciated. Abdomen: Soft, non-tender, non-distended with normoactive bowel sounds. No hepatosplenomegalymegaly. No  rebound/guarding. No obvious abdominal masses. Msk:  Strength and tone normal for age. Extremities/Skin: Warm and dry. No clubbing or cyanosis. No edema. No rashes or suspicious lesions.  Full range of motion of the right elbow although he does have some discomfort with full extension and full flexion. He has not localized tenderness. Neuro: Alert and oriented X 3. Moves all extremities spontaneously. Gait is normal. CNII-XII grossly in tact. Psych:  Responds to questions appropriately with a normal affect.    ASSESSMENT AND PLAN:  59 y.o. year old male with hyperlipidemia, right elbow pain, and erectile dysfunction here for medication refill. - This chart was scribed in my presence and reviewed by me personally.    ICD-9-CM ICD-10-CM   1. Hyperlipidemia 272.4 E78.5 simvastatin (ZOCOR) 20 MG tablet  2. Erectile dysfunction, unspecified erectile dysfunction type 607.84 N52.9 sildenafil (VIAGRA) 100 MG tablet  3. Elbow strain, right, initial encounter 841.9 S56.911A meloxicam (MOBIC) 7.5 MG tablet  4. Polyuria 788.42 R35.8 Ambulatory referral to Urology    By signing my name below, I, Raven Small, attest that this documentation has been prepared under the direction and in the presence of Elvina SidleKurt Lauenstein, MD.  Electronically Signed: Andrew Auaven Small, ED Scribe. 06/27/2015. 2:03 PM.  Signed, Elvina SidleKurt Lauenstein, MD 06/27/2015 1:59 PM

## 2015-06-27 NOTE — Addendum Note (Signed)
Addended by: Elvina SidleLAUENSTEIN, Keni Elison on: 06/27/2015 02:26 PM   Modules accepted: Orders

## 2015-06-27 NOTE — Patient Instructions (Signed)

## 2015-06-28 LAB — COMPLETE METABOLIC PANEL WITH GFR
ALT: 15 U/L (ref 9–46)
AST: 15 U/L (ref 10–35)
Albumin: 4.3 g/dL (ref 3.6–5.1)
Alkaline Phosphatase: 48 U/L (ref 40–115)
BUN: 16 mg/dL (ref 7–25)
CO2: 27 mmol/L (ref 20–31)
Calcium: 9 mg/dL (ref 8.6–10.3)
Chloride: 101 mmol/L (ref 98–110)
Creat: 0.83 mg/dL (ref 0.70–1.33)
GFR, Est African American: >89 mL/min (ref >=60)
GFR, Est Non African American: >89 mL/min (ref >=60)
Glucose, Bld: 71 mg/dL (ref 65–99)
Potassium: 4.6 mmol/L (ref 3.5–5.3)
Sodium: 137 mmol/L (ref 135–146)
Total Bilirubin: 0.6 mg/dL (ref 0.2–1.2)
Total Protein: 6.9 g/dL (ref 6.1–8.1)

## 2015-06-28 LAB — LIPID PANEL
Cholesterol: 197 mg/dL (ref 125–200)
HDL: 50 mg/dL (ref >=40)
LDL Cholesterol: 98 mg/dL (ref <130)
Total CHOL/HDL Ratio: 3.9 Ratio (ref <=5.0)
Triglycerides: 245 mg/dL — ABNORMAL HIGH (ref <150)
VLDL: 49 mg/dL — ABNORMAL HIGH (ref <30)

## 2016-03-26 ENCOUNTER — Telehealth: Payer: Self-pay

## 2016-03-26 NOTE — Telephone Encounter (Signed)
Waiting on payment of $51.75 for 92 pages from [employer]

## 2016-03-28 NOTE — Telephone Encounter (Signed)
Payment received and records faxed on 03/28/16 °

## 2016-04-04 DIAGNOSIS — Z0271 Encounter for disability determination: Secondary | ICD-10-CM
